# Patient Record
Sex: Male | Born: 1980
Health system: Southern US, Community
[De-identification: ages and names within clinical notes are randomized; demographics above are authoritative.]

## PROBLEM LIST (undated history)

## (undated) DIAGNOSIS — N289 Disorder of kidney and ureter, unspecified: Secondary | ICD-10-CM

---

## 2005-06-11 ENCOUNTER — Emergency Department (HOSPITAL_COMMUNITY): Admission: EM | Admit: 2005-06-11 | Discharge: 2005-06-11 | Payer: Self-pay | Admitting: Emergency Medicine
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
## 2006-11-06 ENCOUNTER — Ambulatory Visit: Payer: Self-pay | Admitting: Internal Medicine

## 2006-11-11 ENCOUNTER — Ambulatory Visit (HOSPITAL_COMMUNITY): Admission: RE | Admit: 2006-11-11 | Discharge: 2006-11-11 | Payer: Self-pay | Admitting: Internal Medicine

## 2006-11-11 ENCOUNTER — Ambulatory Visit: Payer: Self-pay | Admitting: Internal Medicine

## 2007-01-03 ENCOUNTER — Ambulatory Visit: Payer: Self-pay | Admitting: Internal Medicine

## 2007-05-06 ENCOUNTER — Emergency Department (HOSPITAL_COMMUNITY): Admission: EM | Admit: 2007-05-06 | Discharge: 2007-05-06 | Payer: Self-pay | Admitting: Emergency Medicine

## 2010-06-06 ENCOUNTER — Emergency Department: Payer: Self-pay | Admitting: Emergency Medicine

## 2011-02-02 NOTE — Op Note (Signed)
NAME:  Lawrence, Rojas NO.:  [id number]   MEDICAL RECORD NO.:  [id number]          PATIENT TYPE:  AMB   LOCATION:  DAY                           FACILITY:  [facility]   PHYSICIAN:  Lionel December, M.D.    DATE OF BIRTH:  July 03, 1981   DATE OF PROCEDURE:  11/11/2006  DATE OF DISCHARGE:                               OPERATIVE REPORT   PROCEDURE:  Esophagogastroduodenoscopy.   INDICATIONS FOR PROCEDURE:  Lawrence Rojas is a 30 year old Caucasian male who  has had symptoms of GERD for at least 15 years, never well controlled  with therapy.  He has been on Prevacid and possibly Protonix in the  past.  He was put on Nexium one a day for months and now he has been on  b.i.d. for about a week and still having problems.  He is undergoing  diagnostic EGD.  Procedure risks were reviewed with the patient and  informed consent was obtained.   MEDICATIONS FOR CONSCIOUS SEDATION:  Benzocaine spray for pharyngeal  topical anesthesia, Demerol 50 mg IV, Versed 15 mg IV.   FINDINGS:  Procedure performed in endoscopy suite.  Patient's vital  signs and O2 saturations were monitored during the procedure and  remained stable.  Patient was placed in the left lateral recumbent  position and Pentax videoscope was passed per oropharynx without any  difficulty into the esophagus.   Esophagus:  Mucosa of the esophagus normal except distally there was a  large triangular erosion extending to GE junction, about 6-7 cm long and  a smaller erosion at and just above GE junction.  GE junction was at 4  cm from incisors.  There was no abnormal mucosa at distal esophagus to  suggest Barrett's.  Hiatus was at 42 cm from the incisors.   Stomach:  It was empty and distended very well with insufflation.  Folds  of the proximal stomach were normal.  Examination of mucosa, multiple  erosions at antrum and prepyloric area but no ulcer was found.  Pyloric  channel was patent.  Angularis, fundus and cardia were  examined by  retroflexing the scope and were normal.   Duodenum:  Bulbar mucosa was normal.  Scope was passed in the second  part of the duodenum where mucosa and folds were normal.  Endoscope was  withdrawn.  Patient tolerated the procedure well.   FINAL DIAGNOSES:  1. Erosive reflux esophagitis with small sliding hiatal hernia.  2. Erosive antral gastritis.   RECOMMENDATIONS:  1. Antireflux measures reinforced.  2. He will continue Nexium at 40 mg p.o. b.i.d.  3. Helicobacter pylori serology will be checked today.   I will be contacting patient with the results of blood tests and further  recommendations.   We will leave him on Nexium b.i.d. for at least eight weeks and go from  there.  We will plan to see him in the office in eight weeks.      Lionel December, M.D.  Electronically Signed     NR/MEDQ  D:  11/11/2006  T:  11/11/2006  Job:  [id number]   cc:   Jeannett Senior  D. Sudie Bailey, M.D.  Fax: [phone]

## 2011-02-02 NOTE — Consult Note (Signed)
NAME:  Lawrence Rojas, Lawrence Rojas NO.:  [id number]   MEDICAL RECORD NO.:  [id number]         PATIENT TYPE:  AMB   LOCATION:                                FACILITY:  [facility]   PHYSICIAN:  Lionel December, M.D.    DATE OF BIRTH:  05-24-1981   DATE OF CONSULTATION:  DATE OF DISCHARGE:                                 CONSULTATION   CHIEF COMPLAINT:  Refractory GERD.   HISTORY OF PRESENT ILLNESS:  Lawrence Rojas is a 30 year old Caucasian male.  He presents with long-standing history of chronic reflux.  He has been  on PPI.  He continues to complain of  frequent regurgitation of  undigested food within a few hours of eating.  He denies any problems  with dysphagia, odynophagia, anorexia, or early satiety.  He had  symptoms of daily heartburn and indigestion.  This is better on b.i.d.  Nexium, although not completely resolved.   PAST MEDICAL HISTORY:  1. Depression.  2. Left arm surgery.   CURRENT MEDICATIONS:  1. Nexium 40 mg b.i.d.  2. Lexapro 20 mg b.i.d.  3. Xanax 0.5 mg q.i.d.  4. Tylenol or Aleve p.r.n.   ALLERGIES:  No known drug allergies.   FAMILY HISTORY:  Is positive for GERD.  Both parents, mother age 82 and  father age 39, are healthy, as well as his brother.   SOCIAL HISTORY:  Lawrence Rojas lives with his mother.  He is employed with  [employer].  He has a 7 pack-year history of tobacco use.  He  consumes a couple of alcoholic beverages every 2-3 months.  Denies any  drug use.   REVIEW OF SYSTEMS:  CONSTITUTIONAL:  Denies any fatigue.  His weight is  stable.  CARDIOVASCULAR:  Denies any chest pain or palpitations.  PULMONARY:  No shortness of breath, dyspnea, cough, hemoptysis.  GI:  See HPI.  Denies any constipation, diarrhea, rectal bleeding, or melena.   PHYSICAL EXAMINATION:  VITAL SIGNS:  Weight 187.5, height 71 inches,  temp 98, blood pressure 110/74 and pulse 72.  IN GENERAL:  Lawrence Rojas is a well-developed, well-nourished, Caucasian  male in no  acute distress.  HEENT:  PERRLA, sclerae nonicteric.  Conjunctivae pink.  Oropharynx pink  and moist without any lesions.  NECK:  Supple with no mass or thyromegaly.  HEART:  Regular rate and rhythm, normal S1, S2 without murmurs, rubs,  clicks or gallops.  LUNGS: Clear to auscultation bilaterally.  ABDOMEN:  Positive bowel sounds times 4, no bruits auscultated.  Soft,  nontender, nondistended without palpable mass or  hepatosplenomegaly.  No tenderness or guarding.  EXTREMITIES:  Without clubbing or edema bilaterally.  SKIN:  Pink, warm, and dry without any rash or jaundice.   IMPRESSION:  Lawrence Rojas is a 30 year old male with history of chronic  gastroesophageal reflux disease.  He has had refractory symptoms.  Within the last month or so, he has noticed frequent regurgitation of  undigested food within a few hours of eating.  He has also had break-  through symptoms on twice a day proton pump inhibitor.  He  is going to  need further evaluation to rule out complicated gastroesophageal reflux  disease including reflux esophagitis, hiatal hernia, eosinophilic  esophagitis, etc.   PLAN:  An EGD with Dr. Karilyn Cota in the near future.  I discussed this  procedure including risks and benefits which include but are not limited  to bleeding, infection, perforation, drug reaction.  He agrees and  informed consent was obtained.   We would like to thank Dr. Sudie Bailey for allowing [place] to participate in  the care of Lawrence Rojas.      Nicholas Lose, N.P.      Lionel December, M.D.  Electronically Signed    KC/MEDQ  D:  11/06/2006  T:  11/06/2006  Job:  [id number]

## 2011-10-08 ENCOUNTER — Emergency Department: Payer: Self-pay | Admitting: Emergency Medicine

## 2011-10-08 LAB — URINALYSIS, COMPLETE
Leukocyte Esterase: NEGATIVE
Ph: 5 (ref 4.5–8.0)
Protein: 30
RBC,UR: 268 /HPF (ref 0–5)
WBC UR: 6 /HPF (ref 0–5)

## 2011-10-12 ENCOUNTER — Emergency Department: Payer: Self-pay | Admitting: Emergency Medicine

## 2011-10-12 LAB — URINALYSIS, COMPLETE
Bacteria: NONE SEEN
Glucose,UR: NEGATIVE mg/dL (ref 0–75)
Leukocyte Esterase: NEGATIVE
Nitrite: NEGATIVE
Protein: NEGATIVE
RBC,UR: <1 /HPF (ref 0–5)
Specific Gravity: 1.009 (ref 1.003–1.030)
Squamous Epithelial: NONE SEEN
WBC UR: 1 /HPF (ref 0–5)

## 2011-10-12 LAB — CBC
MCH: 31.1 pg (ref 26.0–34.0)
MCV: 90 fL (ref 80–100)
Platelet: 233 10*3/uL (ref 150–440)
RBC: 5.19 10*6/uL (ref 4.40–5.90)
RDW: 12.4 % (ref 11.5–14.5)
WBC: 8 10*3/uL (ref 3.8–10.6)

## 2011-10-12 LAB — BASIC METABOLIC PANEL
Anion Gap: 10 (ref 7–16)
BUN: 16 mg/dL (ref 7–18)
Chloride: 108 mmol/L — ABNORMAL HIGH (ref 98–107)
Creatinine: 0.82 mg/dL (ref 0.60–1.30)
Potassium: 3.5 mmol/L (ref 3.5–5.1)
Sodium: 145 mmol/L (ref 136–145)

## 2016-10-13 ENCOUNTER — Encounter: Payer: Self-pay | Admitting: Emergency Medicine

## 2016-10-13 ENCOUNTER — Emergency Department
Admission: EM | Admit: 2016-10-13 | Discharge: 2016-10-13 | Disposition: A | Discharge status: Home / Self Care | Payer: 59 | Attending: Emergency Medicine | Admitting: Emergency Medicine

## 2016-10-13 DIAGNOSIS — R109 Unspecified abdominal pain: Secondary | ICD-10-CM

## 2016-10-13 DIAGNOSIS — R1110 Vomiting, unspecified: Secondary | ICD-10-CM | POA: Diagnosis not present

## 2016-10-13 DIAGNOSIS — F17290 Nicotine dependence, other tobacco product, uncomplicated: Secondary | ICD-10-CM | POA: Diagnosis not present

## 2016-10-13 HISTORY — DX: Disorder of kidney and ureter, unspecified: N28.9

## 2016-10-13 LAB — URINALYSIS, COMPLETE (UACMP) WITH MICROSCOPIC
BILIRUBIN URINE: NEGATIVE
GLUCOSE, UA: NEGATIVE mg/dL
Ketones, ur: NEGATIVE mg/dL
LEUKOCYTES UA: NEGATIVE
NITRITE: NEGATIVE
PH: 6 (ref 5.0–8.0)
Protein, ur: 30 mg/dL — AB
SPECIFIC GRAVITY, URINE: 1.014 (ref 1.005–1.030)

## 2016-10-13 LAB — BASIC METABOLIC PANEL
Anion gap: 6 (ref 5–15)
BUN: 15 mg/dL (ref 6–20)
CALCIUM: 8.8 mg/dL — AB (ref 8.9–10.3)
CO2: 27 mmol/L (ref 22–32)
Chloride: 106 mmol/L (ref 101–111)
Creatinine, Ser: 1.2 mg/dL (ref 0.61–1.24)
GFR calc Af Amer: >60 mL/min (ref >60)
GLUCOSE: 111 mg/dL — AB (ref 65–99)
Potassium: 3.7 mmol/L (ref 3.5–5.1)
Sodium: 139 mmol/L (ref 135–145)

## 2016-10-13 LAB — CBC
HEMATOCRIT: 48.2 % (ref 40.0–52.0)
Hemoglobin: 16.7 g/dL (ref 13.0–18.0)
MCH: 30 pg (ref 26.0–34.0)
MCHC: 34.7 g/dL (ref 32.0–36.0)
MCV: 86.7 fL (ref 80.0–100.0)
PLATELETS: 277 10*3/uL (ref 150–440)
RBC: 5.57 MIL/uL (ref 4.40–5.90)
RDW: 13 % (ref 11.5–14.5)
WBC: 8.6 10*3/uL (ref 3.8–10.6)

## 2016-10-13 MED ORDER — ONDANSETRON HCL 4 MG/2ML IJ SOLN
4.0000 mg | Freq: Once | INTRAMUSCULAR | Status: AC
  Administered 2016-10-13: 4 mg via INTRAVENOUS
  Filled 2016-10-13: qty 2

## 2016-10-13 MED ORDER — NAPROXEN 375 MG PO TABS: 375.0000 mg | ORAL_TABLET | Freq: Two times a day (BID) | ORAL | 0 refills | Status: AC

## 2016-10-13 MED ORDER — KETOROLAC TROMETHAMINE 30 MG/ML IJ SOLN
30.0000 mg | Freq: Once | INTRAMUSCULAR | Status: AC
  Administered 2016-10-13: 30 mg via INTRAVENOUS
  Filled 2016-10-13: qty 1

## 2016-10-13 MED ORDER — PROMETHAZINE HCL 12.5 MG PO TABS: 12.5000 mg | ORAL_TABLET | Freq: Four times a day (QID) | ORAL | 0 refills | Status: DC | PRN

## 2016-10-13 MED ORDER — TAMSULOSIN HCL 0.4 MG PO CAPS: 0.4000 mg | ORAL_CAPSULE | Freq: Every day | ORAL | 0 refills | Status: DC

## 2016-10-13 MED ORDER — SODIUM CHLORIDE 0.9 % IV BOLUS (SEPSIS)
1000.0000 mL | Freq: Once | INTRAVENOUS | Status: AC
  Administered 2016-10-13: 1000 mL via INTRAVENOUS

## 2016-10-13 NOTE — ED Provider Notes (Addendum)
[facility] Emergency Department Provider Note  ____________________________________________   I have reviewed the triage vital signs and the nursing notes.   HISTORY  Chief Complaint Flank Pain    HPI Lawrence Rojas is a 36 y.o. male  with a long history of recurrent kidney stones presents today with a kidney stone he believes. Sudden onset left flank pain radiating towards his groin exactly the same as multiple prior kidney stones. Has had 2 episodes of emesis. Antecedent to this event had no symptoms. No hematuria no dysuria no fever. He proceeds with no fever, he has not seen his urine since this started. He denies headache abdominal pain or testicular pain or swelling, he denies any easy bleeding or bruising and he has had no history of anticoagulation. He has had CT scans for this in the past. Patient states he does have a history of narcotic use and for a nonnarcotic approach to pain control     Past Medical History:  Diagnosis Date  . Renal disorder    kidney stones    There are no active problems to display for this patient.   History reviewed. No pertinent surgical history.  Prior to Admission medications   Not on File    Allergies Patient has no known allergies.  No family history on file.  Social History Social History  Substance Use Topics  . Smoking status: Current Every Day Smoker    Packs/day: 0.50  . Smokeless tobacco: Current User    Types: Snuff  . Alcohol use No    Review of Systems Constitutional: No fever/chills Eyes: No visual changes. ENT: No sore throat. No stiff neck no neck pain Cardiovascular: Denies chest pain. Respiratory: Denies shortness of breath. Gastrointestinal:   Positive vomiting.  No diarrhea.  No constipation. Genitourinary: Negative for dysuria. Musculoskeletal: Negative lower extremity swelling Skin: Negative for rash. Neurological: Negative for severe headaches, focal weakness or  numbness. 10-point ROS otherwise negative.  ____________________________________________   PHYSICAL EXAM:  VITAL SIGNS: ED Triage Vitals  Enc Vitals Group     BP 10/13/16 0504 (!) 170/100     Pulse Rate 10/13/16 0504 81     Resp 10/13/16 0504 16     Temp 10/13/16 0504 97.5 F (36.4 C)     Temp Source 10/13/16 0504 Oral     SpO2 10/13/16 0504 98 %     Weight 10/13/16 0507 200 lb (90.7 kg)     Height 10/13/16 0507 5\' 11"  (1.803 m)     Head Circumference --      Peak Flow --      Pain Score 10/13/16 0507 7     Pain Loc --      Pain Edu? --      Excl. in GC? --     Constitutional: Alert and oriented. Well appearing and in no acute distress. Eyes: Conjunctivae are normal. PERRL. EOMI. Head: Atraumatic. Nose: No congestion/rhinnorhea. Mouth/Throat: Mucous membranes are moist.  Oropharynx non-erythematous. Neck: No stridor.   Nontender with no meningismus Cardiovascular: Normal rate, regular rhythm. Grossly normal heart sounds.  Good peripheral circulation. Respiratory: Normal respiratory effort.  No retractions. Lungs CTAB. Abdominal: Soft and nontender. No distention. No guarding no rebound Back:  There is no focal tenderness or step off.  there is no midline tenderness there are no lesions noted. there is Focal left mild CVA tenderness GU: Patient declines at this time Musculoskeletal: No lower extremity tenderness, no upper extremity tenderness. No joint effusions, no  DVT signs strong distal pulses no edema Neurologic:  Normal speech and language. No gross focal neurologic deficits are appreciated.  Skin:  Skin is warm, dry and intact. No rash noted. Psychiatric: Mood and affect are normal. Speech and behavior are normal.  ____________________________________________   LABS (all labs ordered are listed, but only abnormal results are displayed)  Labs Reviewed  URINALYSIS, COMPLETE (UACMP) WITH MICROSCOPIC  CBC  BASIC METABOLIC PANEL    ____________________________________________  EKG  I personally interpreted any EKGs ordered by me or triage  ____________________________________________  RADIOLOGY  I reviewed any imaging ordered by me or triage that were performed during my shift and, if possible, patient and/or family made aware of any abnormal findings. ____________________________________________   PROCEDURES  Procedure(s) performed: None  Procedures  Critical Care performed: None  ____________________________________________   INITIAL IMPRESSION / ASSESSMENT AND PLAN / ED COURSE  Pertinent labs & imaging results that were available during my care of the patient were reviewed by me and considered in my medical decision making (see chart for details).  Patient here with sudden onset left flank pain and a long history of kidney stones. It's been about a year since he passed a stone. He feels very strongly this is a kidney stone. He and I discussed the pros and cons and the risks and benefits of CT scan. Obviously, a CT scan will not in any way health the patient passed a stone, only give us an idea of how large it is. Clinically, we have strong reason to believe that he has a kidney stone. Nothing at this time to suggest AAA or dissection, for example. We'll check kidney function, urinalysis and if all that checks out it is my hope that we can avoid CT as the patient has had CTs in the past and the radiation burden with no clear discernible patient benefit is not justifiable in my opinion at this time. Given that he prefers to avoid narcotic pain medications, we will give the patient Toradol and antiemetics and we will reassess. Initial blood pressure somewhat elevated but again he is probably passing a kidney stone.  ----------------------------------------- 7:36 AM on 10/13/2016 -----------------------------------------  Signed out to dr. Roxan Hockeyobinson.     ____________________________________________   FINAL CLINICAL IMPRESSION(S) / ED DIAGNOSES  Final diagnoses:  None      This chart was dictated using voice recognition software.  Despite best efforts to proofread,  errors can occur which can change meaning.      Jeanmarie PlantJames A McShane, MD 10/13/16 [id number]    Jeanmarie PlantJames A McShane, MD 10/13/16 [phone]

## 2016-10-13 NOTE — ED Triage Notes (Signed)
Pt states that he has had left flank pain since 0330 this am. Pt has hx of kidney stones and is experiencing some emesis due to pain. Pt is ambulatory to triage and is in NAD at this time.

## 2016-10-13 NOTE — ED Provider Notes (Signed)
Patient received in sign-out from Dr. Alphonzo LemmingsMcShane.  Workup and evaluation pending Ua. No evidence of UTI. Patient remains hemodynamic stable. Do not feel that further CT imaging clinically indicated at this time. Patient stable for close outpatient follow-up.      Willy EddyPatrick Thao Vanover, MD 10/13/16 [phone]

## 2016-10-13 NOTE — ED Notes (Signed)
Received report from matt rn, care assumed.

## 2016-10-16 ENCOUNTER — Encounter: Payer: Self-pay | Admitting: Internal Medicine

## 2017-10-18 DIAGNOSIS — Z0000 Encounter for general adult medical examination without abnormal findings: Secondary | ICD-10-CM | POA: Diagnosis not present

## 2017-11-15 DIAGNOSIS — Z23 Encounter for immunization: Secondary | ICD-10-CM | POA: Diagnosis not present

## 2017-11-15 DIAGNOSIS — Z0000 Encounter for general adult medical examination without abnormal findings: Secondary | ICD-10-CM | POA: Diagnosis not present

## 2017-11-15 DIAGNOSIS — K219 Gastro-esophageal reflux disease without esophagitis: Secondary | ICD-10-CM | POA: Diagnosis not present

## 2017-11-15 DIAGNOSIS — Z720 Tobacco use: Secondary | ICD-10-CM | POA: Diagnosis not present

## 2017-12-24 DIAGNOSIS — N200 Calculus of kidney: Secondary | ICD-10-CM | POA: Diagnosis not present

## 2018-01-15 ENCOUNTER — Ambulatory Visit: Payer: Self-pay | Admitting: Urology

## 2018-02-20 ENCOUNTER — Ambulatory Visit (INDEPENDENT_AMBULATORY_CARE_PROVIDER_SITE_OTHER): Payer: 59 | Admitting: Urology

## 2018-02-20 ENCOUNTER — Encounter: Payer: Self-pay | Admitting: Urology

## 2018-02-20 VITALS — BP 131/88 | HR 94 | Ht 71.0 in | Wt 206.2 lb

## 2018-02-20 DIAGNOSIS — Z87442 Personal history of urinary calculi: Secondary | ICD-10-CM | POA: Diagnosis not present

## 2018-02-20 NOTE — Progress Notes (Signed)
02/20/2018 3:58 PM   Lawrence Rojas 08-19-1981 [id number]  Referring provider: Barbette ReichmannHande, Vishwanath, MD [address] [facility] [place], [place] [id number]  Chief Complaint  Patient presents with  . Nephrolithiasis    HPI: Lawrence Rojas is a 37 year old male with a previous history of recurrent stone disease.  He has never required surgical intervention and states he has always passed his stones.  [facility]2018 complaining of left flank pain radiating to the left lower quadrant.  His symptoms feel identical to previous stone episodes.  He passed a stone several days later and has been asymptomatic since that time.  No imaging was performed.  He has no voiding complaints.  Denies gross hematuria.  PMH: Past Medical History:  Diagnosis Date  . Renal disorder    kidney stones    Surgical History: No past surgical history on file.  Home Medications:  Allergies as of 02/20/2018   No Known Allergies     Medication List        Accurate as of 02/20/18  3:58 PM. Always use your most recent med list.          naproxen 500 MG tablet Commonly known as:  NAPROSYN Take 500 mg by mouth 2 (two) times daily with a meal.   NEXIUM 40 MG capsule Generic drug:  esomeprazole Take 40 mg by mouth.       Allergies: No Known Allergies  Family History: No family history on file.  Social History:  reports that he has been smoking.  He has a 10.00 pack-year smoking history. His smokeless tobacco use includes snuff. He reports that he does not drink alcohol or use drugs.  ROS: UROLOGY Frequent Urination?: No Hard to postpone urination?: No Burning/pain with urination?: No Get up at night to urinate?: No Leakage of urine?: No Urine stream starts and stops?: No Trouble starting stream?: No Do you have to strain to urinate?: No Blood in urine?: No Urinary tract infection?: No Sexually transmitted disease?: No Injury to  kidneys or bladder?: No Painful intercourse?: No Weak stream?: No Erection problems?: No Penile pain?: No  Gastrointestinal Nausea?: No Vomiting?: No Indigestion/heartburn?: No Diarrhea?: No Constipation?: No  Constitutional Fever: No Night sweats?: No Weight loss?: No Fatigue?: No  Skin Skin rash/lesions?: No Itching?: No  Eyes Blurred vision?: No Double vision?: No  Ears/Nose/Throat Sore throat?: No Sinus problems?: No  Hematologic/Lymphatic Swollen glands?: No Easy bruising?: No  Cardiovascular Leg swelling?: No Chest pain?: No  Respiratory Cough?: No Shortness of breath?: No  Endocrine Excessive thirst?: No  Musculoskeletal Back pain?: No Joint pain?: No  Neurological Headaches?: No Dizziness?: No  Psychologic Depression?: No Anxiety?: No  Physical Exam: BP 131/88 (BP Location: Left Arm, Patient Position: Sitting, Cuff Size: Large)   Pulse 94   Ht 5\' 11"  (1.803 m)   Wt 206 lb 3.2 oz (93.5 kg)   SpO2 99%   BMI 28.76 kg/m   Constitutional:  Alert and oriented, No acute distress. HEENT: [place] AT, moist mucus membranes.  Trachea midline, no masses. Cardiovascular: No clubbing, cyanosis, or edema. Respiratory: Normal respiratory effort, no increased work of breathing. GI: Abdomen is soft, nontender, nondistended, no abdominal masses GU: No CVA tenderness Lymph: No cervical or inguinal lymphadenopathy. Skin: No rashes, bruises or suspicious lesions. Neurologic: Grossly intact, no focal deficits, moving all 4 extremities. Psychiatric: Normal mood and affect.   Assessment & Plan:   37 year old male with a  history of recurrent stone disease.  A KUB was ordered to evaluate for any obvious renal calculi.  I have also recommended a metabolic evaluation through [facility].   Riki Altes, MD  [facility] [address], [address] [place], [place] [phone]

## 2018-03-02 ENCOUNTER — Encounter: Payer: Self-pay | Admitting: Urology

## 2018-03-05 DIAGNOSIS — H5213 Myopia, bilateral: Secondary | ICD-10-CM | POA: Diagnosis not present

## 2018-10-07 DIAGNOSIS — M545 Low back pain: Secondary | ICD-10-CM | POA: Diagnosis not present

## 2019-12-12 ENCOUNTER — Ambulatory Visit: Payer: 59 | Attending: Internal Medicine

## 2019-12-12 DIAGNOSIS — Z23 Encounter for immunization: Secondary | ICD-10-CM

## 2019-12-12 NOTE — Progress Notes (Signed)
   Covid-19 Vaccination Clinic  Name:  Lawrence Rojas    MRN: [id number] DOB: [birth year]  12/12/2019  [name] was observed post Covid-19 immunization for 15 minutes without incident. He was provided with Vaccine Information Sheet and instruction to access the V-Safe system.   [name] was instructed to call 911 with any severe reactions post vaccine: Marland Kitchen Difficulty breathing  . Swelling of face and throat  . A fast heartbeat  . A bad rash all over body  . Dizziness and weakness   Immunizations Administered    Name Date Dose VIS Date Route   Pfizer COVID-19 Vaccine 12/12/2019  4:05 PM 0.3 mL 08/28/2019 Intramuscular   Manufacturer: [employer], [employer]   Lot: [id number]   NDC: [address]-9

## 2020-01-02 ENCOUNTER — Ambulatory Visit: Payer: 59 | Attending: Internal Medicine

## 2020-01-02 ENCOUNTER — Other Ambulatory Visit: Payer: Self-pay

## 2020-01-02 DIAGNOSIS — Z23 Encounter for immunization: Secondary | ICD-10-CM

## 2020-01-02 NOTE — Progress Notes (Signed)
   Covid-19 Vaccination Clinic  Name:  QUANTA ROHER    MRN: [id number] DOB: Jan 29, 1981  01/02/2020  Mr. Porche was observed post Covid-19 immunization for 15 minutes without incident. He was provided with Vaccine Information Sheet and instruction to access the V-Safe system.   Mr. Dec was instructed to call 911 with any severe reactions post vaccine: Marland Kitchen Difficulty breathing  . Swelling of face and throat  . A fast heartbeat  . A bad rash all over body  . Dizziness and weakness   Immunizations Administered    Name Date Dose VIS Date Route   Pfizer COVID-19 Vaccine 01/02/2020  4:00 PM 0.3 mL 08/28/2019 Intramuscular   Manufacturer: [employer], [employer]   Lot: [id number]   NDC: [address]-8

## 2021-05-16 ENCOUNTER — Other Ambulatory Visit: Payer: Self-pay | Admitting: Physician Assistant

## 2021-05-16 ENCOUNTER — Other Ambulatory Visit: Payer: Self-pay

## 2021-05-16 ENCOUNTER — Ambulatory Visit
Admission: RE | Admit: 2021-05-16 | Discharge: 2021-05-16 | Disposition: A | Discharge status: Home / Self Care | Payer: 59 | Source: Ambulatory Visit | Attending: Physician Assistant | Admitting: Physician Assistant

## 2021-05-16 DIAGNOSIS — R1011 Right upper quadrant pain: Secondary | ICD-10-CM | POA: Insufficient documentation

## 2022-09-10 IMAGING — CT CT RENAL STONE PROTOCOL
2 of 4 series · 16 of 46 positions shown, 18 images · non-contrast
Comparison: CT abdomen pelvis 06/07/2010

CLINICAL DATA: Kidney stones. Right upper quadrant pain for past
2-3 weeks. Discomfort with urination gross hematuria.

EXAM:
CT ABDOMEN AND PELVIS WITHOUT CONTRAST
TECHNIQUE: Multidetector CT imaging of the abdomen and pelvis was performed
following the standard protocol without IV contrast.

[Series 2: stone full standard · axial · 0.91mm/px · z∈[-620,-146]mm · 13 of 105 slices shown, 15 images]
[im 5/105  soft-tissue]
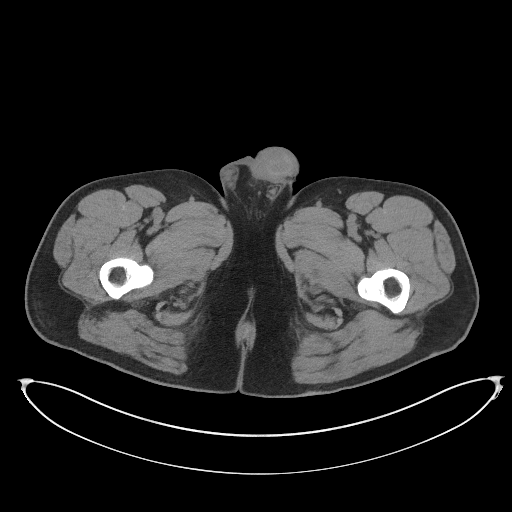
[im 5/105  bone]
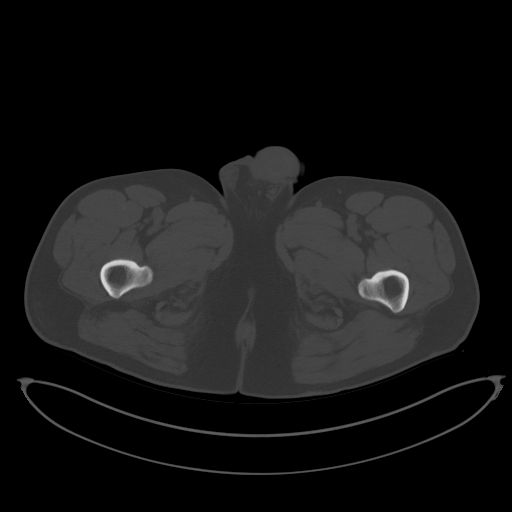
[im 13/105  soft-tissue]
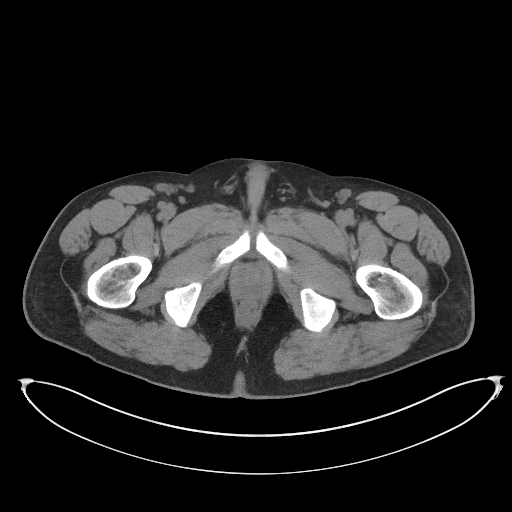
[im 21/105  soft-tissue]
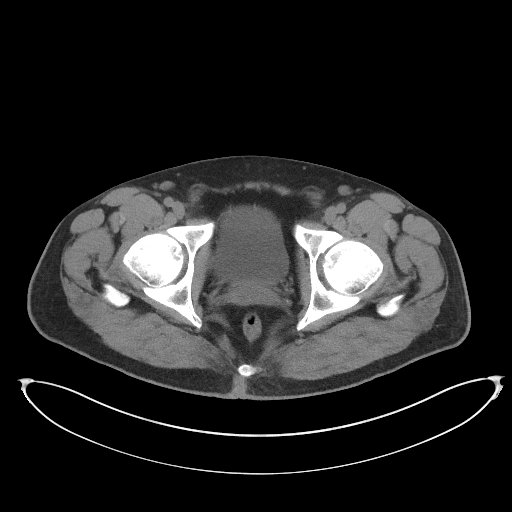
[im 30/105  soft-tissue]
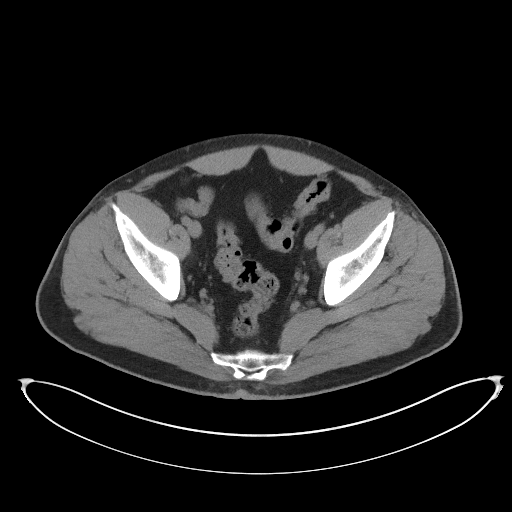
[im 38/105  soft-tissue]
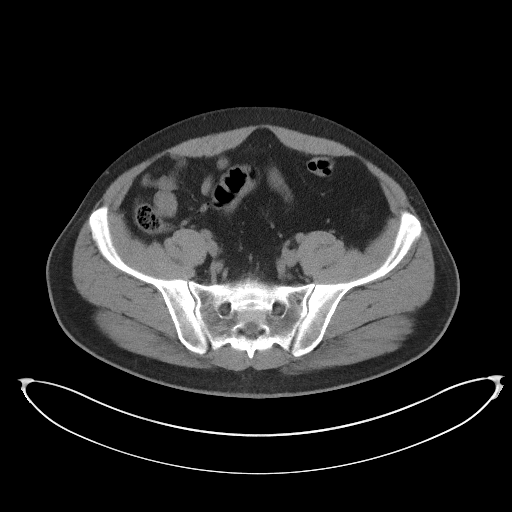
[im 46/105  soft-tissue]
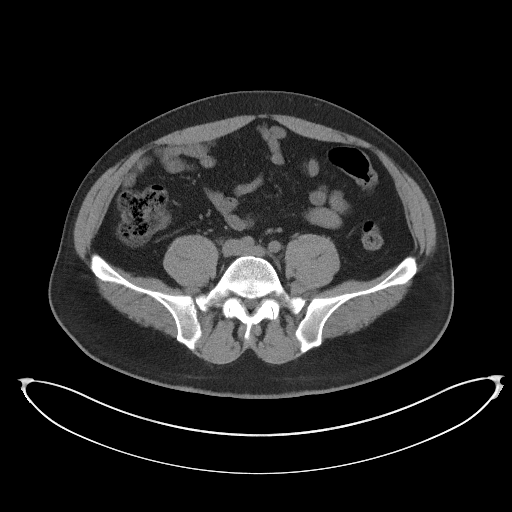
[im 55/105  soft-tissue]
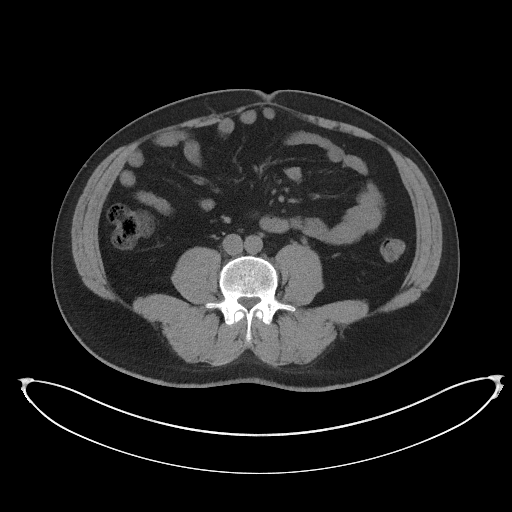
[im 59/105  soft-tissue]
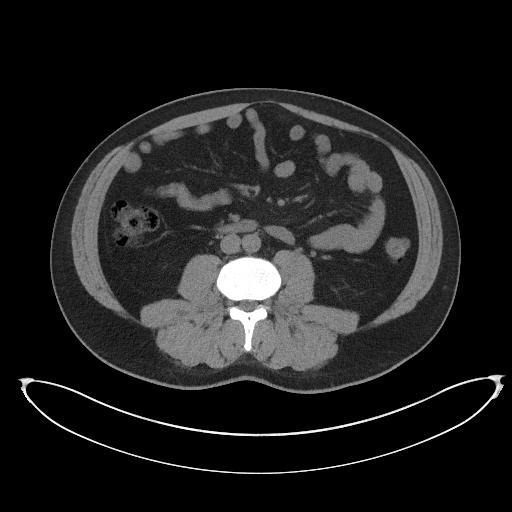
[im 67/105  soft-tissue]
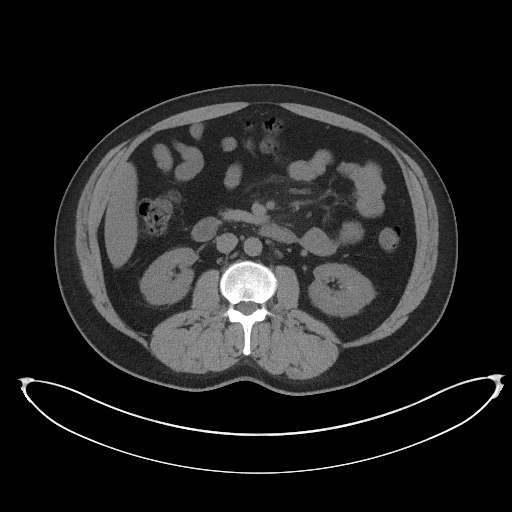
[im 67/105  bone]
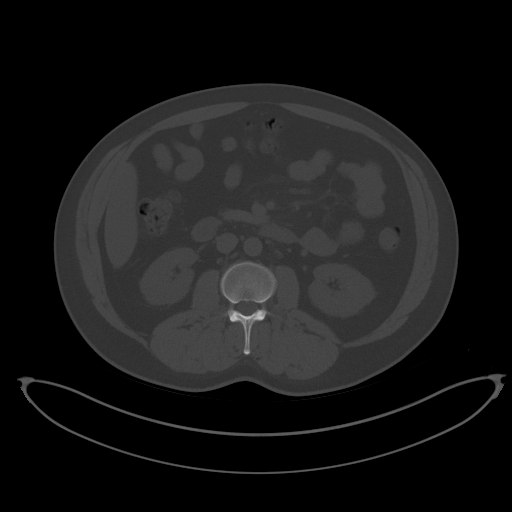
[im 75/105  soft-tissue]
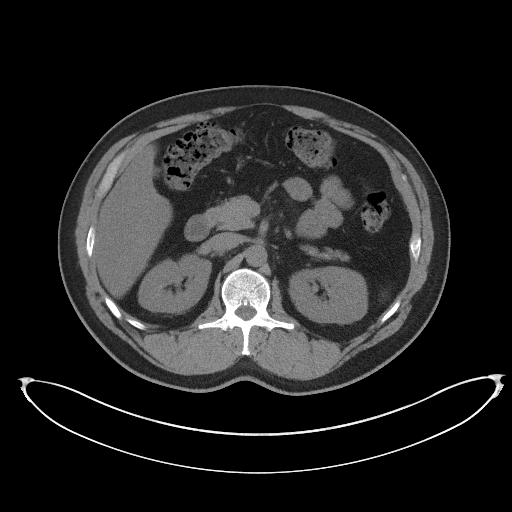
[im 84/105  soft-tissue]
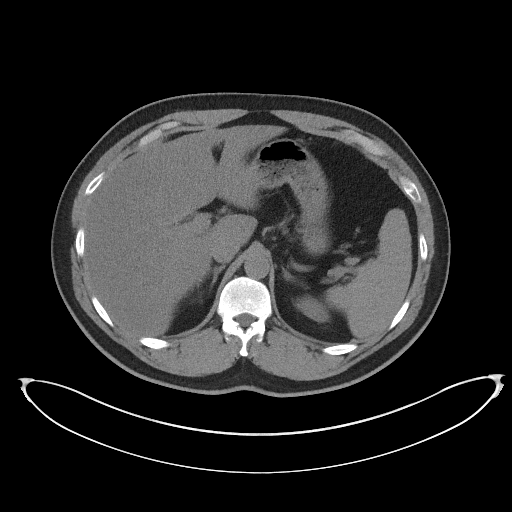
[im 92/105  soft-tissue]
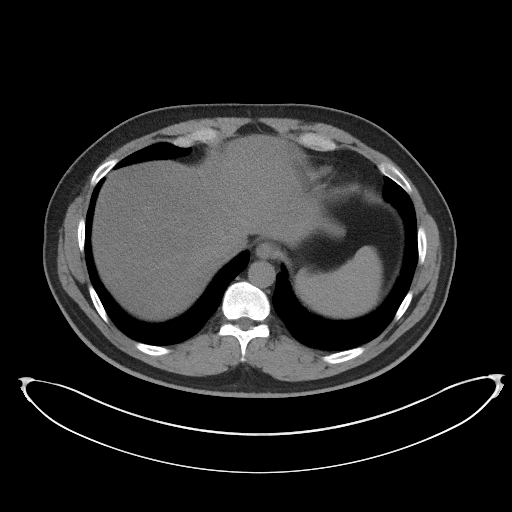
[im 100/105  soft-tissue]
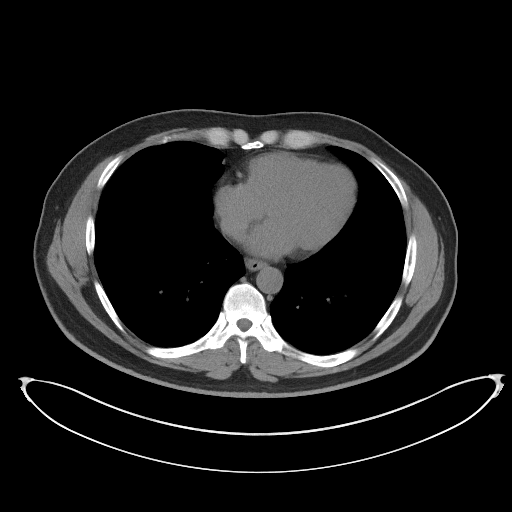

[Series 5: coronal · coronal · 0.87mm/px · 3 of 151 slices shown]
[im 51/151  soft-tissue]
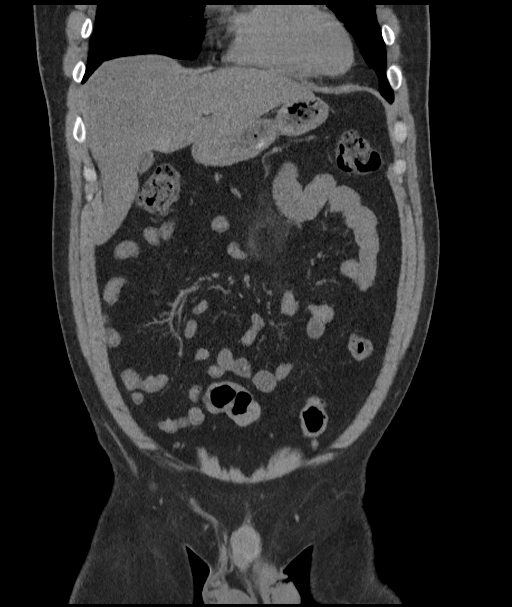
[im 67/151  soft-tissue]
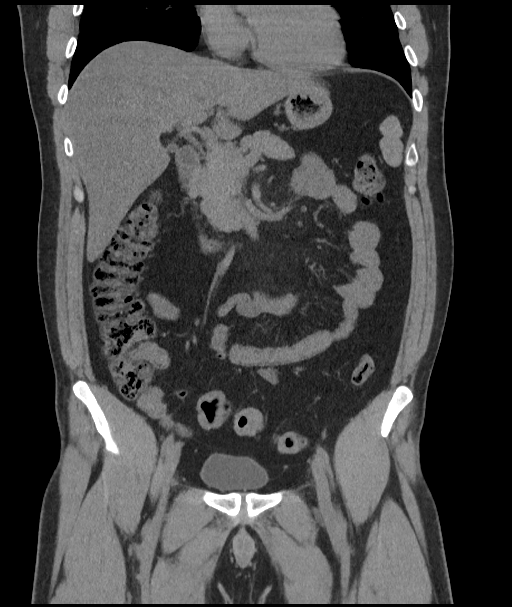
[im 84/151  soft-tissue]
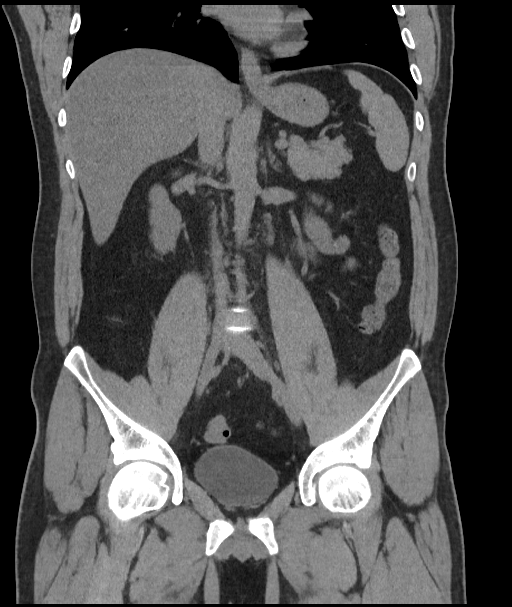

[16 of 46 positions shown; findings below may reference images not displayed]

FINDINGS: Lower chest: No acute abnormality.

Hepatobiliary: Similar-appearing slight hypodensity of the right
hepatic lobe. Otherwise no focal liver abnormality. No gallstones,
gallbladder wall thickening, or pericholecystic fluid. No biliary
dilatation.

Pancreas: No focal lesion. Normal pancreatic contour. No surrounding
inflammatory changes. No main pancreatic ductal dilatation.

Spleen: Normal in size without focal abnormality.

Adrenals/Urinary Tract:

No adrenal nodule bilaterally.

Bilateral kidneys enhance symmetrically.

Multiple calcified stones are noted within bilateral kidneys
measuring up to 1.1 cm on the right and punctate on the left. No
hydronephrosis. No hydroureter.

The urinary bladder is unremarkable.

Stomach/Bowel: Stomach is within normal limits. No evidence of bowel
wall thickening or dilatation. Appendix appears normal.

Vascular/Lymphatic: No abdominal aorta or iliac aneurysm. No
abdominal, pelvic, or inguinal lymphadenopathy.

Reproductive: Prostate is unremarkable.

Other: No intraperitoneal free fluid. No intraperitoneal free gas.
No organized fluid collection.

Musculoskeletal:

No abdominal wall hernia or abnormality.

No suspicious lytic or blastic osseous lesions. No acute displaced
fracture.
IMPRESSION: Nonobstructive bilateral nephrolithiasis measuring up to 11 mm on
the right.

## 2024-12-10 ENCOUNTER — Ambulatory Visit: Admitting: Nurse Practitioner
